# Patient Record
Sex: Female | Born: 1985 | Race: White | Hispanic: No | Marital: Married
Health system: Southern US, Community
[De-identification: ages and names within clinical notes are randomized; demographics above are authoritative.]

## PROBLEM LIST (undated history)

## (undated) HISTORY — PX: APPENDECTOMY: SHX54

---

## 2020-03-24 DIAGNOSIS — M549 Dorsalgia, unspecified: Secondary | ICD-10-CM | POA: Insufficient documentation

## 2020-04-11 NOTE — Progress Notes (Unsigned)
[facility] [address] [place] [address] Phone: [phone] Subjective:   Bruce Donath, am serving as a scribe for Dr. Antoine Primas. This visit occurred during the SARS-CoV-2 public health emergency.  Safety protocols were in place, including screening questions prior to the visit, additional usage of staff PPE, and extensive cleaning of exam room while observing appropriate contact time as indicated for disinfecting solutions.   I'm seeing this patient by the request  of:  Patient, No Pcp Per  CC: Low back pain  QQI:[id number]  Pamela Kaiser is a 35 y.o. female coming in with complaint of back pain since January 28th. Patient states that she is having right lower back pain around T12-L1. Also has chronic neck and left shoulder pain. Notices sharp pain with lumbar flexion that can radiate into right hip. 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 Has tried Tylenol for pain relief.  Patient is allergic to anti-inflammatories so has not taken any.       Social History   Socioeconomic History  . Marital status: Married    Spouse name: Not on file  . Number of children: Not on file  . Years of education: Not on file  . Highest education level: Not on file  Occupational History  . Not on file  Tobacco Use  . Smoking status: Not on file  . Smokeless tobacco: Not on file  Substance and Sexual Activity  . Alcohol use: Not on file  . Drug use: Not on file  . Sexual activity: Not on file  Other Topics Concern  . Not on file  Social History Narrative  . Not on file   Social Determinants of Health   Financial Resource Strain: Not on file  Food Insecurity: Not on file  Transportation Needs: Not on file  Physical Activity: Not on file  Stress: Not on file  Social Connections: Not on file   Allergies  Allergen Reactions  . Nsaids Anaphylaxis   No family history on file.  Current Outpatient Medications (Endocrine & Metabolic):  .   predniSONE (DELTASONE) 20 MG tablet, Take 1 tablet (20 mg total) by mouth daily with breakfast.      Current Outpatient Medications (Other):  .  amphetamine-dextroamphetamine (ADDERALL XR) 15 MG 24 hr capsule, Take by mouth every morning. Marland Kitchen  tiZANidine (ZANAFLEX) 2 MG tablet, Take 1 tablet (2 mg total) by mouth at bedtime.   Reviewed prior external information including notes and imaging from  primary care provider As well as notes that were available from care everywhere and other healthcare systems.  Past medical history, social, surgical and family history all reviewed in electronic medical record.  No pertanent information unless stated regarding to the chief complaint.   Review of Systems:  No headache, visual changes, nausea, vomiting, diarrhea, constipation, dizziness, abdominal pain, skin rash, fevers, chills, night sweats, weight loss, swollen lymph nodes, joint swelling, chest pain, shortness of breath, mood changes. POSITIVE muscle aches  Objective  Blood pressure 110/80, pulse (!) 106, height 5' 2.5" (1.588 m), weight 122 lb (55.3 kg), SpO2 98 %.   General: No apparent distress alert and oriented x3 mood and affect normal, dressed appropriately.  HEENT: Pupils equal, extraocular movements intact  Respiratory: Patient's speak in full sentences and does not appear short of breath  Cardiovascular: No lower extremity edema, non tender, no erythema  MSK: Patient's low back exam does have some loss of lordosis.  Patient does have tenderness to  palpation in the paraspinal musculature mostly around the L1-L2 area on the right.  No spinous process tenderness.  No abdominal pain noted.  No abdominal guarding noted.  Negative straight leg test.  Very minimal discomfort with Pearlean Brownie on the left side that causes more pain on the right side.  Good extension more pain with flexion of the back but does have full range of motion.  Osteopathic findings L2 flexed rotated and side bent  right Sacrum right on right   [id number]; 15 additional minutes spent for Therapeutic exercises as stated in above notes.  This included exercises focusing on stretching, strengthening, with significant focus on eccentric aspects.   Long term goals include an improvement in range of motion, strength, endurance as well as avoiding reinjury. Patient's frequency would include in 1-2 times a day, 3-5 times a week for a duration of 6-12 weeks. Low back exercises that included:  Pelvic tilt/bracing instruction to focus on control of the pelvic girdle and lower abdominal muscles  Glute strengthening exercises, focusing on proper firing of the glutes without engaging the low back muscles Proper stretching techniques for maximum relief for the hamstrings, hip flexors, low back and some rotation where tolerated   Proper technique shown and discussed handout in great detail with ATC.  All questions were discussed and answered.      Impression and Recommendations:     The above documentation has been reviewed and is accurate and complete Judi Saa, DO

## 2020-04-12 ENCOUNTER — Other Ambulatory Visit: Payer: Self-pay

## 2020-04-12 ENCOUNTER — Ambulatory Visit: Payer: 59 | Admitting: Family Medicine

## 2020-04-12 ENCOUNTER — Encounter: Payer: Self-pay | Admitting: Family Medicine

## 2020-04-12 ENCOUNTER — Ambulatory Visit (INDEPENDENT_AMBULATORY_CARE_PROVIDER_SITE_OTHER): Payer: 59

## 2020-04-12 VITALS — BP 110/80 | HR 106 | Ht 62.5 in | Wt 122.0 lb

## 2020-04-12 DIAGNOSIS — M545 Low back pain, unspecified: Secondary | ICD-10-CM | POA: Diagnosis not present

## 2020-04-12 DIAGNOSIS — M542 Cervicalgia: Secondary | ICD-10-CM | POA: Diagnosis not present

## 2020-04-12 DIAGNOSIS — M999 Biomechanical lesion, unspecified: Secondary | ICD-10-CM | POA: Diagnosis not present

## 2020-04-12 DIAGNOSIS — F909 Attention-deficit hyperactivity disorder, unspecified type: Secondary | ICD-10-CM | POA: Insufficient documentation

## 2020-04-12 MED ORDER — PREDNISONE 20 MG PO TABS: 20.0000 mg | ORAL_TABLET | Freq: Every day | ORAL | 0 refills | Status: AC

## 2020-04-12 MED ORDER — TIZANIDINE HCL 2 MG PO TABS: 2.0000 mg | ORAL_TABLET | Freq: Every day | ORAL | 0 refills | Status: DC

## 2020-04-12 NOTE — Assessment & Plan Note (Signed)
   Decision today to treat with OMT was based on Physical Exam  After verbal consent patient was treated with , ME, FPR techniques in  lumbar and sacral areas, all areas are chronic   Patient tolerated the procedure well with improvement in symptoms  Patient given exercises, stretches and lifestyle modifications  See medications in patient instructions if given  Patient will follow up in 4 weeks

## 2020-04-12 NOTE — Assessment & Plan Note (Signed)
Right-sided lower back pain that seems to be localized.  Seems to be more in the L2 vicinity right side.  Discussed that this is to be more of a muscle spasm.  Patient does have a history of ovarian cyst.  Encouraged her to monitor for any signs and symptoms of this.  Patient understands what to look out for.  Patient denies any fevers chills any abnormal weight loss or weight gain.  Denies any changes in cardiac.  X-rays ordered today to further evaluate for any bony abnormality but I think it is highly unlikely.  Patient denies any recent illnesses any dysuria.  Patient given a short course of prednisone with patient having an allergy to ibuprofen.  Given low dose of a muscle relaxer and work with Event organiser to learn home exercises.  Follow-up with me again in 4 weeks.  Attempted muscle energy osteopathic angulation today as well

## 2020-04-12 NOTE — Patient Instructions (Signed)
Prednisone 20mg  for 5 days 2mg  zanaflex at night for 3 nights then as needed Xray on way out Watch for signs for ovarian cyst See me in 4-5 weeks

## 2020-05-11 ENCOUNTER — Ambulatory Visit: Payer: 59 | Admitting: Family Medicine

## 2020-06-08 ENCOUNTER — Ambulatory Visit: Payer: 59 | Admitting: Family Medicine

## 2020-07-21 ENCOUNTER — Other Ambulatory Visit: Payer: Self-pay | Admitting: Family Medicine

## 2020-08-18 ENCOUNTER — Other Ambulatory Visit: Payer: Self-pay | Admitting: Family Medicine

## 2020-09-19 ENCOUNTER — Other Ambulatory Visit: Payer: Self-pay | Admitting: Family Medicine

## 2020-09-27 ENCOUNTER — Emergency Department (HOSPITAL_COMMUNITY)
Admission: EM | Admit: 2020-09-27 | Discharge: 2020-09-27 | Disposition: A | Discharge status: Home / Self Care | Payer: Worker's Compensation | Attending: Emergency Medicine | Admitting: Emergency Medicine

## 2020-09-27 ENCOUNTER — Emergency Department (HOSPITAL_COMMUNITY): Payer: Worker's Compensation

## 2020-09-27 ENCOUNTER — Other Ambulatory Visit: Payer: Self-pay

## 2020-09-27 ENCOUNTER — Encounter (HOSPITAL_COMMUNITY): Payer: Self-pay | Admitting: Emergency Medicine

## 2020-09-27 DIAGNOSIS — M25571 Pain in right ankle and joints of right foot: Secondary | ICD-10-CM | POA: Diagnosis not present

## 2020-09-27 DIAGNOSIS — X501XXA Overexertion from prolonged static or awkward postures, initial encounter: Secondary | ICD-10-CM | POA: Diagnosis not present

## 2020-09-27 DIAGNOSIS — S92001A Unspecified fracture of right calcaneus, initial encounter for closed fracture: Secondary | ICD-10-CM

## 2020-09-27 DIAGNOSIS — Y92481 Parking lot as the place of occurrence of the external cause: Secondary | ICD-10-CM | POA: Diagnosis not present

## 2020-09-27 DIAGNOSIS — S92021A Displaced fracture of anterior process of right calcaneus, initial encounter for closed fracture: Secondary | ICD-10-CM | POA: Diagnosis not present

## 2020-09-27 DIAGNOSIS — Z20822 Contact with and (suspected) exposure to covid-19: Secondary | ICD-10-CM | POA: Diagnosis not present

## 2020-09-27 DIAGNOSIS — Y9301 Activity, walking, marching and hiking: Secondary | ICD-10-CM | POA: Diagnosis not present

## 2020-09-27 DIAGNOSIS — I951 Orthostatic hypotension: Secondary | ICD-10-CM

## 2020-09-27 DIAGNOSIS — S99911A Unspecified injury of right ankle, initial encounter: Secondary | ICD-10-CM | POA: Diagnosis present

## 2020-09-27 DIAGNOSIS — Y990 Civilian activity done for income or pay: Secondary | ICD-10-CM | POA: Diagnosis not present

## 2020-09-27 LAB — CBC WITH DIFFERENTIAL/PLATELET
Abs Immature Granulocytes: 0.03 10*3/uL (ref 0.00–0.07)
Basophils Absolute: 0.1 10*3/uL (ref 0.0–0.1)
Basophils Relative: 1 %
Eosinophils Absolute: 0.1 10*3/uL (ref 0.0–0.5)
Eosinophils Relative: 1 %
HCT: 41.6 % (ref 36.0–46.0)
Hemoglobin: 13.4 g/dL (ref 12.0–15.0)
Immature Granulocytes: 0 %
Lymphocytes Relative: 27 %
Lymphs Abs: 2.2 10*3/uL (ref 0.7–4.0)
MCH: 31.3 pg (ref 26.0–34.0)
MCHC: 32.2 g/dL (ref 30.0–36.0)
MCV: 97.2 fL (ref 80.0–100.0)
Monocytes Absolute: 0.4 10*3/uL (ref 0.1–1.0)
Monocytes Relative: 5 %
Neutro Abs: 5.4 10*3/uL (ref 1.7–7.7)
Neutrophils Relative %: 66 %
Platelets: 398 10*3/uL (ref 150–400)
RBC: 4.28 MIL/uL (ref 3.87–5.11)
RDW: 11.9 % (ref 11.5–15.5)
WBC: 8.2 10*3/uL (ref 4.0–10.5)
nRBC: 0 % (ref 0.0–0.2)

## 2020-09-27 LAB — RESP PANEL BY RT-PCR (FLU A&B, COVID) ARPGX2
Influenza A by PCR: NEGATIVE
Influenza B by PCR: NEGATIVE
SARS Coronavirus 2 by RT PCR: NEGATIVE

## 2020-09-27 LAB — URINALYSIS, ROUTINE W REFLEX MICROSCOPIC
Bilirubin Urine: NEGATIVE
Glucose, UA: NEGATIVE mg/dL
Ketones, ur: NEGATIVE mg/dL
Leukocytes,Ua: NEGATIVE
Nitrite: NEGATIVE
Protein, ur: NEGATIVE mg/dL
Specific Gravity, Urine: 1.008 (ref 1.005–1.030)
pH: 6 (ref 5.0–8.0)

## 2020-09-27 LAB — BASIC METABOLIC PANEL
Anion gap: 10 (ref 5–15)
BUN: 8 mg/dL (ref 6–20)
CO2: 23 mmol/L (ref 22–32)
Calcium: 8.6 mg/dL — ABNORMAL LOW (ref 8.9–10.3)
Chloride: 106 mmol/L (ref 98–111)
Creatinine, Ser: 0.65 mg/dL (ref 0.44–1.00)
GFR, Estimated: >60 mL/min (ref >60)
Glucose, Bld: 113 mg/dL — ABNORMAL HIGH (ref 70–99)
Potassium: 4 mmol/L (ref 3.5–5.1)
Sodium: 139 mmol/L (ref 135–145)

## 2020-09-27 LAB — I-STAT BETA HCG BLOOD, ED (MC, WL, AP ONLY): I-stat hCG, quantitative: <5 m[IU]/mL (ref <5)

## 2020-09-27 LAB — CBG MONITORING, ED: Glucose-Capillary: 88 mg/dL (ref 70–99)

## 2020-09-27 MED ORDER — HYDROCODONE-ACETAMINOPHEN 5-325 MG PO TABS: 1.0000 | ORAL_TABLET | ORAL | 0 refills | Status: AC | PRN

## 2020-09-27 MED ORDER — SODIUM CHLORIDE 0.9 % IV SOLN: INTRAVENOUS | Status: DC

## 2020-09-27 MED ORDER — ACETAMINOPHEN 500 MG PO TABS
1000.0000 mg | ORAL_TABLET | Freq: Once | ORAL | Status: AC
  Administered 2020-09-27: 1000 mg via ORAL
  Filled 2020-09-27: qty 2

## 2020-09-27 MED ORDER — SODIUM CHLORIDE 0.9 % IV BOLUS
1000.0000 mL | Freq: Once | INTRAVENOUS | Status: AC
  Administered 2020-09-27: 1000 mL via INTRAVENOUS

## 2020-09-27 NOTE — ED Provider Notes (Signed)
[facility]-EMERGENCY DEPT Provider Note   CSN: [id number] Arrival date & time: 09/27/20  5631     History Chief Complaint  Patient presents with   Hypotension    Pamela Kaiser is a 35 y.o. female.  Pt presents to the ED today with a near-syncopal event.  Pt said she woke up today with a little cough and eyes watering.  She took a Production designer, theatre/television/film and went to work.  She otherwise felt fine.  When she got out of the car, she said her legs felt like jello and her head felt really weird and she felt like she was going to pass out.  She sat down in the parking lot, but it was really awkward and she twisted her right ankle. She does not think she passed out completely.  She tried to stand up, but felt dizzy again, so she sat back down.   EMS said her initial BP was 80/50.  They gave her 300 cc of NS and bp back to nl.  She is feeling better.  She denies any n/v/d.        History reviewed. No pertinent past medical history.  Patient Active Problem List   Diagnosis Date Noted   Lower back pain 04/12/2020   Nonallopathic lesion of lumbar region 04/12/2020   Attention-deficit hyperactivity disorder, unspecified type 04/12/2020   Back pain 03/24/2020    History reviewed. No pertinent surgical history.   OB History   No obstetric history on file.     History reviewed. No pertinent family history.     Home Medications Prior to Admission medications   Medication Sig Start Date End Date Taking? Authorizing Provider  amphetamine-dextroamphetamine (ADDERALL XR) 15 MG 24 hr capsule Take 15 mg by mouth every morning.   Yes [provider]  cetirizine (ZYRTEC) 10 MG tablet Take 10 mg by mouth at bedtime as needed for allergies.   Yes [provider]  HYDROcodone-acetaminophen (NORCO/VICODIN) 5-325 MG tablet Take 1 tablet by mouth every 4 (four) hours as needed. 09/27/20  Yes Jacalyn Lefevre, MD  Multiple Vitamins-Minerals (HAIR SKIN AND NAILS FORMULA PO) Take  1 tablet by mouth at bedtime.   Yes [provider]  Pseudoephedrine-APAP-DM (DAYQUIL PO) Take 2 capsules by mouth every 6 (six) hours as needed (congestion/cold).   Yes [provider]  tiZANidine (ZANAFLEX) 2 MG tablet TAKE 1 TABLET BY MOUTH EVERYDAY AT BEDTIME Patient taking differently: Take 2 mg by mouth at bedtime as needed for muscle spasms. 09/19/20  Yes Antoine Primas M, DO  TRI-ESTARYLLA 0.18/0.215/0.25 MG-35 MCG tablet Take 1 tablet by mouth daily. 07/27/20  Yes [provider]  predniSONE (DELTASONE) 20 MG tablet Take 1 tablet (20 mg total) by mouth daily with breakfast. Patient not taking: No sig reported 04/12/20   Judi Saa, DO    Allergies    Aspirin and Nsaids  Review of Systems   Review of Systems  Musculoskeletal:        Right ankle pain  Neurological:  Positive for syncope and light-headedness.  All other systems reviewed and are negative.  Physical Exam Updated Vital Signs BP (!) 139/97   Pulse 80   Temp 97.9 F (36.6 C)   Resp 15   SpO2 100%   Physical Exam Vitals and nursing note reviewed.  Constitutional:      Appearance: Normal appearance.  HENT:     Head: Normocephalic and atraumatic.     Right Ear: External ear normal.  Left Ear: External ear normal.     Nose: Nose normal.     Mouth/Throat:     Mouth: Mucous membranes are dry.  Eyes:     Extraocular Movements: Extraocular movements intact.     Conjunctiva/sclera: Conjunctivae normal.     Pupils: Pupils are equal, round, and reactive to light.  Cardiovascular:     Rate and Rhythm: Normal rate and regular rhythm.     Pulses: Normal pulses.     Heart sounds: Normal heart sounds.  Pulmonary:     Effort: Pulmonary effort is normal.     Breath sounds: Normal breath sounds.  Abdominal:     General: Abdomen is flat. Bowel sounds are normal.     Palpations: Abdomen is soft.  Musculoskeletal:        General: Normal range of motion.     Cervical back: Normal range  of motion and neck supple.       Legs:  Skin:    General: Skin is warm.     Capillary Refill: Capillary refill takes less than 2 seconds.  Neurological:     General: No focal deficit present.     Mental Status: She is alert and oriented to person, place, and time.  Psychiatric:        Mood and Affect: Mood normal.        Behavior: Behavior normal.    ED Results / Procedures / Treatments   Labs (all labs ordered are listed, but only abnormal results are displayed) Labs Reviewed  BASIC METABOLIC PANEL - Abnormal; Notable for the following components:      Result Value   Glucose, Bld 113 (*)    Calcium 8.6 (*)    All other components within normal limits  URINALYSIS, ROUTINE W REFLEX MICROSCOPIC - Abnormal; Notable for the following components:   Hgb urine dipstick SMALL (*)    Bacteria, UA RARE (*)    All other components within normal limits  RESP PANEL BY RT-PCR (FLU A&B, COVID) ARPGX2  CBC WITH DIFFERENTIAL/PLATELET  CBG MONITORING, ED  I-STAT BETA HCG BLOOD, ED ([facility], [facility], [facility] ONLY)    EKG EKG Interpretation  Date/Time:  Wednesday September 27 2020 08:59:55 EDT Ventricular Rate:  76 PR Interval:  140 QRS Duration: 86 QT Interval:  432 QTC Calculation: 486 R Axis:   63 Text Interpretation: Sinus rhythm RSR' in V1 or V2, probably normal variant Borderline prolonged QT interval No old tracing to compare Confirmed by Jacalyn Lefevre [phone]) on 09/27/2020 9:11:16 AM  Radiology DG Ankle Complete Right  Result Date: 09/27/2020 CLINICAL DATA:  Right ankle trauma EXAM: RIGHT ANKLE - COMPLETE 3+ VIEW COMPARISON:  None. FINDINGS: Mildly displaced fracture of the lateral anterior process of the calcaneus. Ankle mortise is maintained on nonweightbearing views. Joint spaces are well maintained. Normal mineralization. Soft tissue swelling of the ankle. IMPRESSION: Mildly displaced fracture of the lateral anterior process of the calcaneus. Electronically Signed   By: Allegra Lai MD   On:  09/27/2020 10:37    Procedures Procedures   Medications Ordered in ED Medications  sodium chloride 0.9 % bolus 1,000 mL (1,000 mLs Intravenous Bolus 09/27/20 0900)    And  0.9 %  sodium chloride infusion ( Intravenous New Bag/Given 09/27/20 0910)  acetaminophen (TYLENOL) tablet 1,000 mg (1,000 mg Oral Given 09/27/20 0944)    ED Course  I have reviewed the triage vital signs and the nursing notes.  Pertinent labs & imaging results that were available during my  care of the patient were reviewed by me and considered in my medical decision making (see chart for details).    MDM Rules/Calculators/A&P                           BP is better after IVFs.  She is feeling better.  Ankle xray shows a calcaneus fx.  Pt placed in a cam walker and instructed to f/u with Dr. Everardo Pacific.  She is stable for d/c.  Return if worse.  Avoid dayquil. Final Clinical Impression(s) / ED Diagnoses Final diagnoses:  Orthostatic hypotension  Closed displaced fracture of right calcaneus, unspecified portion of calcaneus, initial encounter    Rx / DC Orders ED Discharge Orders          Ordered    HYDROcodone-acetaminophen (NORCO/VICODIN) 5-325 MG tablet  Every 4 hours PRN        09/27/20 1120             Jacalyn Lefevre, MD 09/27/20 1125

## 2020-09-27 NOTE — ED Triage Notes (Signed)
BIBA from work d/t near syncopal episode when walking into work. BP 80/50 & HR 80 initially, then 130/80 after 300 cc fluid. Orthostatics negative. CBG 102. No significant medical history.   18 G LAC

## 2020-10-19 ENCOUNTER — Other Ambulatory Visit: Payer: Self-pay | Admitting: Family Medicine

## 2020-11-10 ENCOUNTER — Other Ambulatory Visit: Payer: Self-pay | Admitting: Family Medicine

## 2020-12-08 ENCOUNTER — Other Ambulatory Visit: Payer: Self-pay | Admitting: Family Medicine

## 2021-01-05 ENCOUNTER — Other Ambulatory Visit: Payer: Self-pay | Admitting: Family Medicine

## 2021-02-03 ENCOUNTER — Other Ambulatory Visit: Payer: Self-pay | Admitting: Family Medicine

## 2021-03-03 ENCOUNTER — Other Ambulatory Visit: Payer: Self-pay | Admitting: Family Medicine

## 2021-03-28 ENCOUNTER — Other Ambulatory Visit: Payer: Self-pay | Admitting: Family Medicine

## 2022-04-24 IMAGING — DX DG ANKLE COMPLETE 3+V*R*
3 series · 3 of 3 positions shown · non-contrast
Comparison: None.

CLINICAL DATA: Right ankle trauma

EXAM:
RIGHT ANKLE - COMPLETE 3+ VIEW

[ankle ap]
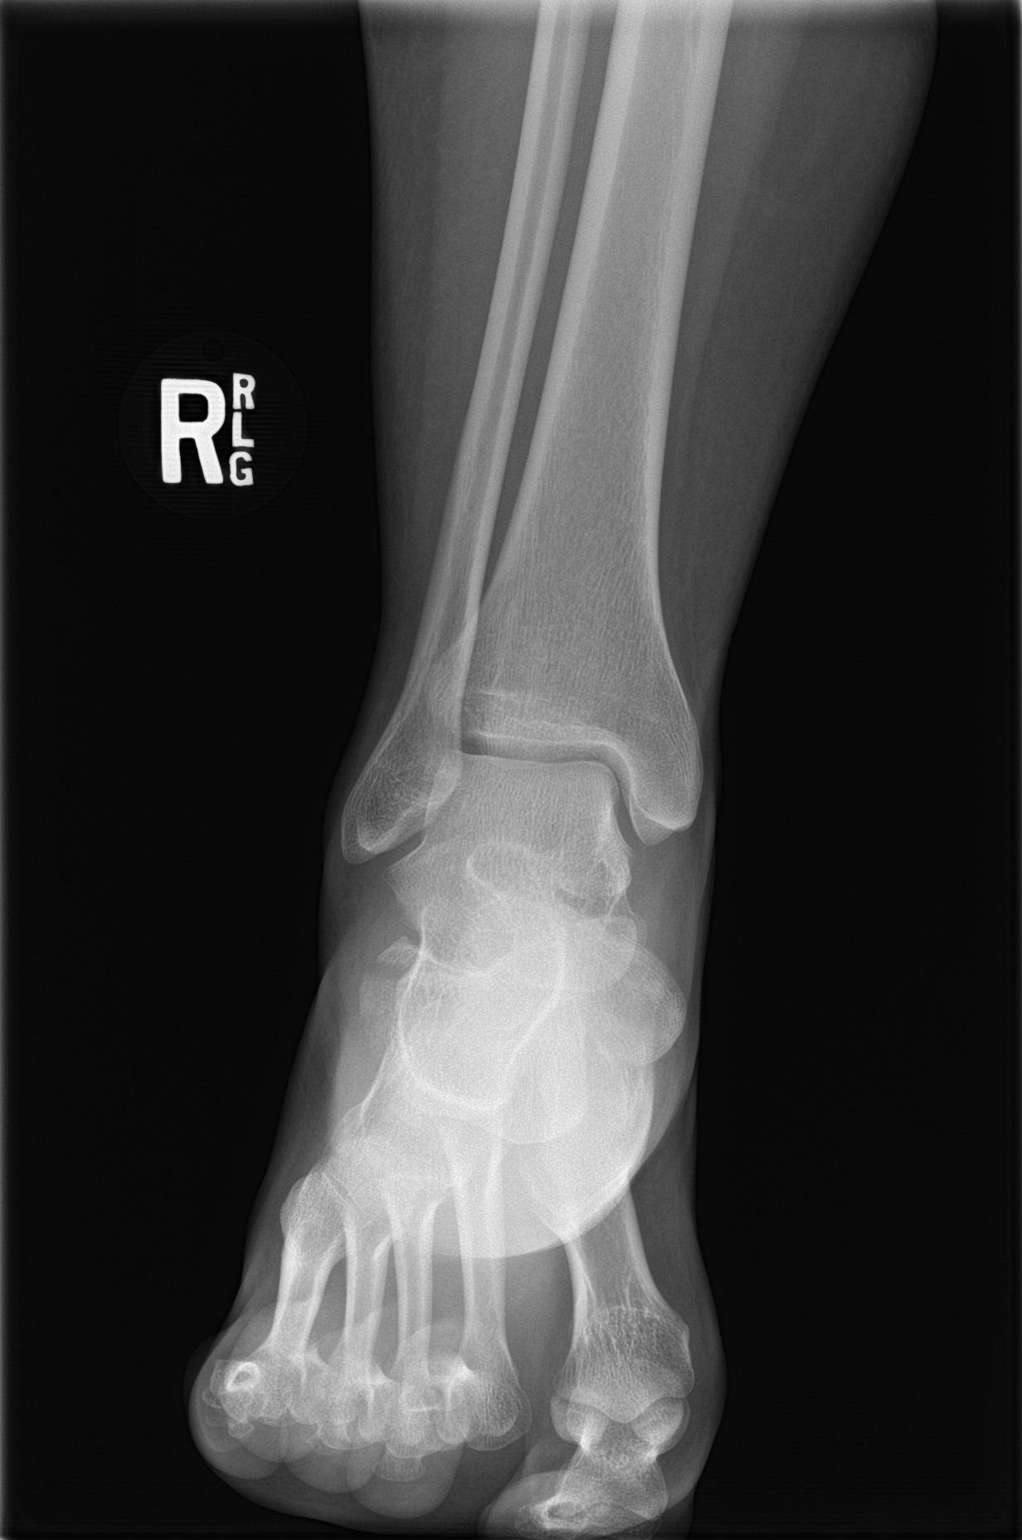

[ankle obl]
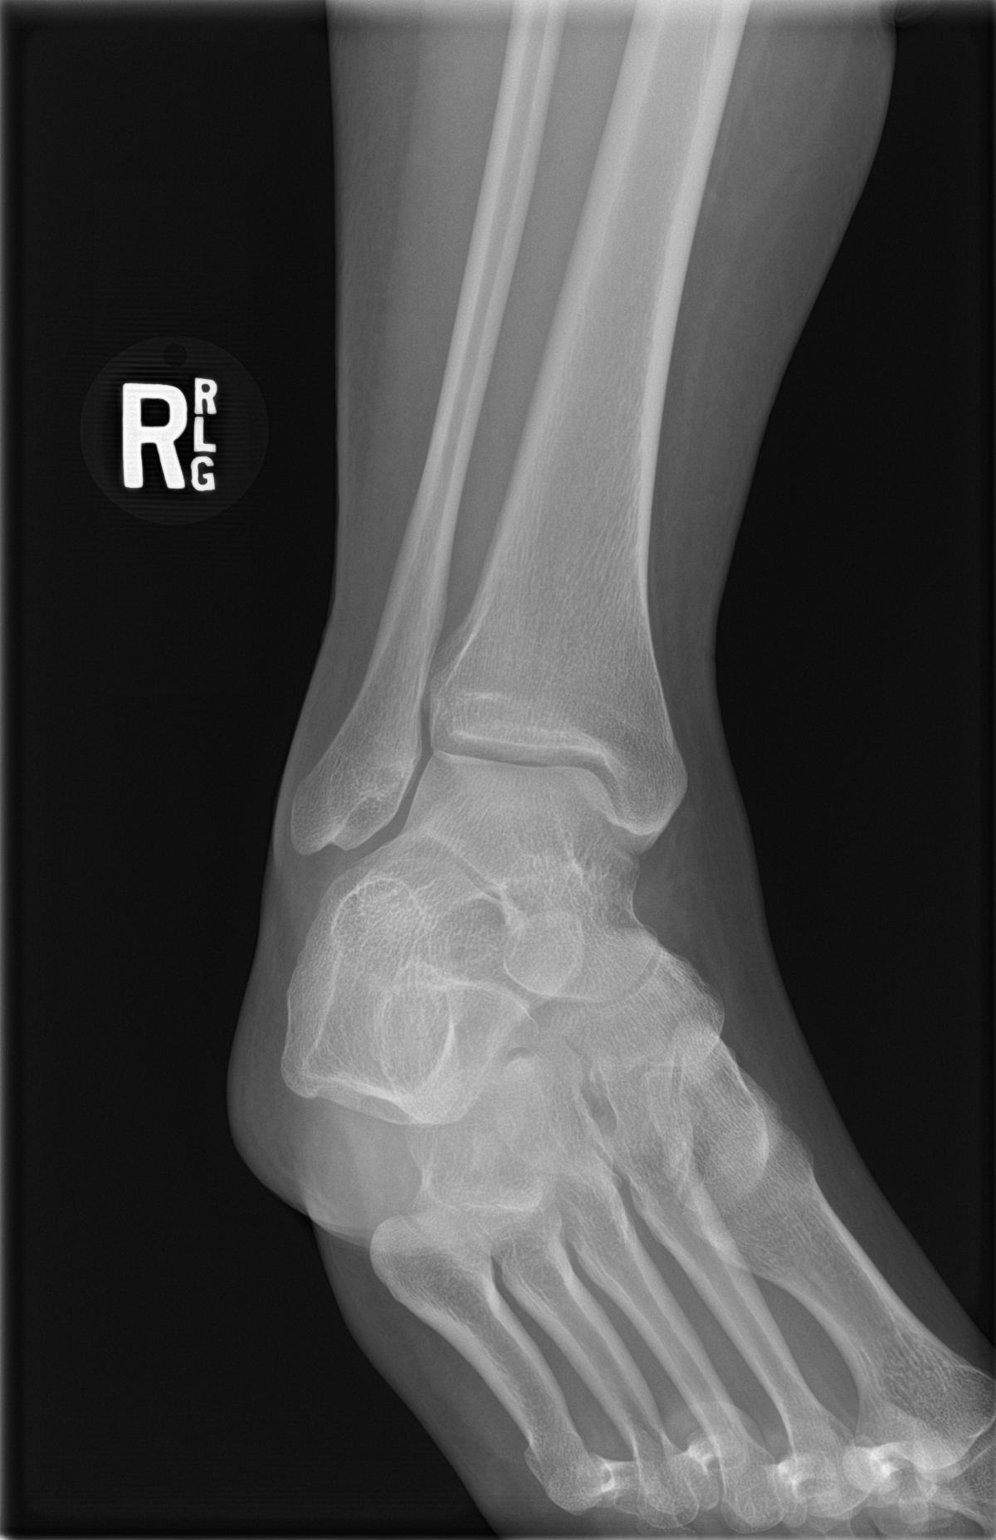

[ankle lat]
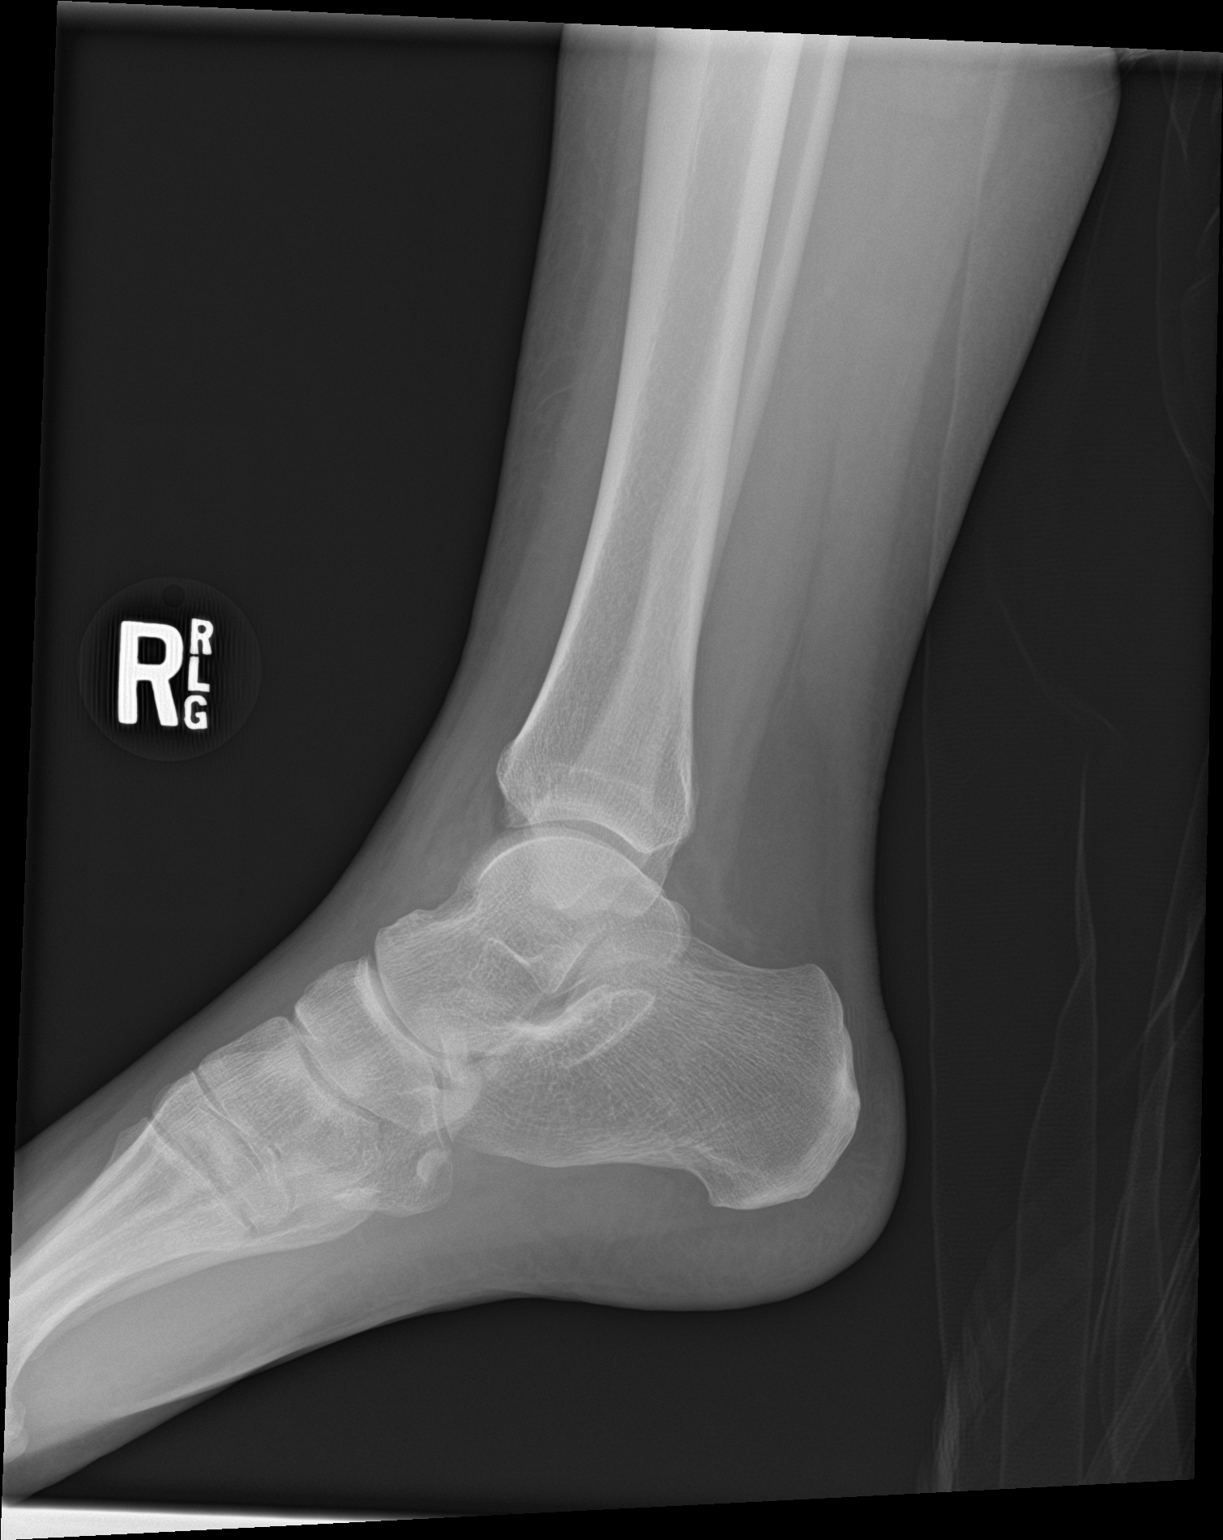

[3 of 3 positions shown; findings below may reference images not displayed]

FINDINGS: Mildly displaced fracture of the lateral anterior process of the
calcaneus. Ankle mortise is maintained on nonweightbearing views.
Joint spaces are well maintained. Normal mineralization. Soft tissue
swelling of the ankle.
IMPRESSION: Mildly displaced fracture of the lateral anterior process of the
calcaneus.

## 2023-09-30 DIAGNOSIS — K649 Unspecified hemorrhoids: Secondary | ICD-10-CM | POA: Diagnosis not present

## 2023-11-02 ENCOUNTER — Other Ambulatory Visit (HOSPITAL_COMMUNITY)

## 2023-11-02 ENCOUNTER — Other Ambulatory Visit: Payer: Self-pay

## 2023-11-02 ENCOUNTER — Encounter (HOSPITAL_BASED_OUTPATIENT_CLINIC_OR_DEPARTMENT_OTHER): Payer: Self-pay

## 2023-11-02 ENCOUNTER — Emergency Department (HOSPITAL_BASED_OUTPATIENT_CLINIC_OR_DEPARTMENT_OTHER)

## 2023-11-02 ENCOUNTER — Emergency Department (HOSPITAL_BASED_OUTPATIENT_CLINIC_OR_DEPARTMENT_OTHER): Admission: EM | Admit: 2023-11-02 | Discharge: 2023-11-03 | Disposition: A | Discharge status: Home / Self Care

## 2023-11-02 DIAGNOSIS — D259 Leiomyoma of uterus, unspecified: Secondary | ICD-10-CM | POA: Diagnosis not present

## 2023-11-02 DIAGNOSIS — K769 Liver disease, unspecified: Secondary | ICD-10-CM | POA: Diagnosis not present

## 2023-11-02 DIAGNOSIS — R102 Pelvic and perineal pain: Secondary | ICD-10-CM | POA: Diagnosis not present

## 2023-11-02 DIAGNOSIS — R1031 Right lower quadrant pain: Secondary | ICD-10-CM | POA: Insufficient documentation

## 2023-11-02 DIAGNOSIS — D219 Benign neoplasm of connective and other soft tissue, unspecified: Secondary | ICD-10-CM

## 2023-11-02 LAB — CBC WITH DIFFERENTIAL/PLATELET
Abs Immature Granulocytes: 0.03 K/uL (ref 0.00–0.07)
Basophils Absolute: 0.1 K/uL (ref 0.0–0.1)
Basophils Relative: 1 %
Eosinophils Absolute: 0.1 K/uL (ref 0.0–0.5)
Eosinophils Relative: 1 %
HCT: 45.6 % (ref 36.0–46.0)
Hemoglobin: 15.4 g/dL — ABNORMAL HIGH (ref 12.0–15.0)
Immature Granulocytes: 0 %
Lymphocytes Relative: 49 %
Lymphs Abs: 4.1 K/uL — ABNORMAL HIGH (ref 0.7–4.0)
MCH: 31.3 pg (ref 26.0–34.0)
MCHC: 33.8 g/dL (ref 30.0–36.0)
MCV: 92.7 fL (ref 80.0–100.0)
Monocytes Absolute: 0.6 K/uL (ref 0.1–1.0)
Monocytes Relative: 7 %
Neutro Abs: 3.6 K/uL (ref 1.7–7.7)
Neutrophils Relative %: 42 %
Platelets: 540 K/uL — ABNORMAL HIGH (ref 150–400)
RBC: 4.92 MIL/uL (ref 3.87–5.11)
RDW: 12 % (ref 11.5–15.5)
WBC: 8.6 K/uL (ref 4.0–10.5)
nRBC: 0 % (ref 0.0–0.2)

## 2023-11-02 LAB — URINALYSIS, ROUTINE W REFLEX MICROSCOPIC
Bilirubin Urine: NEGATIVE
Glucose, UA: NEGATIVE mg/dL
Ketones, ur: NEGATIVE mg/dL
Leukocytes,Ua: NEGATIVE
Nitrite: NEGATIVE
Protein, ur: NEGATIVE mg/dL
Specific Gravity, Urine: 1.02 (ref 1.005–1.030)
pH: 7 (ref 5.0–8.0)

## 2023-11-02 LAB — COMPREHENSIVE METABOLIC PANEL WITH GFR
ALT: 25 U/L (ref 0–44)
AST: 23 U/L (ref 15–41)
Albumin: 4.5 g/dL (ref 3.5–5.0)
Alkaline Phosphatase: 60 U/L (ref 38–126)
Anion gap: 11 (ref 5–15)
BUN: 10 mg/dL (ref 6–20)
CO2: 26 mmol/L (ref 22–32)
Calcium: 9.4 mg/dL (ref 8.9–10.3)
Chloride: 105 mmol/L (ref 98–111)
Creatinine, Ser: 0.81 mg/dL (ref 0.44–1.00)
GFR, Estimated: >60 mL/min (ref >60)
Glucose, Bld: 115 mg/dL — ABNORMAL HIGH (ref 70–99)
Potassium: 4.3 mmol/L (ref 3.5–5.1)
Sodium: 142 mmol/L (ref 135–145)
Total Bilirubin: 0.3 mg/dL (ref 0.0–1.2)
Total Protein: 7.3 g/dL (ref 6.5–8.1)

## 2023-11-02 LAB — URINALYSIS, MICROSCOPIC (REFLEX)

## 2023-11-02 LAB — LIPASE, BLOOD: Lipase: 34 U/L (ref 11–51)

## 2023-11-02 LAB — PREGNANCY, URINE: Preg Test, Ur: NEGATIVE

## 2023-11-02 MED ORDER — HYDROMORPHONE HCL 1 MG/ML IJ SOLN: 1.0000 mg | Freq: Once | INTRAMUSCULAR | Status: DC

## 2023-11-02 MED ORDER — ONDANSETRON HCL 4 MG/2ML IJ SOLN
4.0000 mg | Freq: Once | INTRAMUSCULAR | Status: AC
  Administered 2023-11-02: 4 mg via INTRAVENOUS
  Filled 2023-11-02: qty 2

## 2023-11-02 MED ORDER — IOHEXOL 300 MG/ML  SOLN
100.0000 mL | Freq: Once | INTRAMUSCULAR | Status: AC | PRN
  Administered 2023-11-02: 100 mL via INTRAVENOUS

## 2023-11-02 MED ORDER — HYDROMORPHONE HCL 1 MG/ML IJ SOLN
0.5000 mg | Freq: Once | INTRAMUSCULAR | Status: AC
  Administered 2023-11-02: 0.5 mg via INTRAVENOUS
  Filled 2023-11-02: qty 1

## 2023-11-02 NOTE — ED Provider Notes (Signed)
 [facility] Provider Note   CSN: [id number] Arrival date & time: 11/02/23  2057     Patient presents with: Abdominal Pain   Pamela Kaiser is a 38 y.o. female.   This is a 38 year old female presenting emergency department with right lower quadrant abdominal pain/pelvic pain that started abruptly at 7:30 PM.  Had just finished eating.  Reports pain constant but varies in intensity.  Radiating towards her back and down towards her hip.  Some nausea, but no vomiting.  No urinary symptoms.  Last bowel movement today.  Has a history of prior appendectomy.  Started menstruating yesterday   Abdominal Pain      Prior to Admission medications   Medication Sig Start Date End Date Taking? Authorizing Provider  amphetamine-dextroamphetamine (ADDERALL XR) 15 MG 24 hr capsule Take 15 mg by mouth every morning.    [provider]  cetirizine (ZYRTEC) 10 MG tablet Take 10 mg by mouth at bedtime as needed for allergies.    [provider]  HYDROcodone -acetaminophen  (NORCO/VICODIN) 5-325 MG tablet Take 1 tablet by mouth every 4 (four) hours as needed. 09/27/20   Dean Clarity, MD  Multiple Vitamins-Minerals (HAIR SKIN AND NAILS FORMULA PO) Take 1 tablet by mouth at bedtime.    [provider]  predniSONE  (DELTASONE ) 20 MG tablet Take 1 tablet (20 mg total) by mouth daily with breakfast. Patient not taking: No sig reported 04/12/20   Smith, Zachary M, DO  Pseudoephedrine-APAP-DM (DAYQUIL PO) Take 2 capsules by mouth every 6 (six) hours as needed (congestion/cold).    [provider]  tiZANidine  (ZANAFLEX ) 2 MG tablet TAKE 1 TABLET BY MOUTH EVERYDAY AT BEDTIME 03/05/21   Smith, Zachary M, DO  TRI-ESTARYLLA 0.18/0.215/0.25 MG-35 MCG tablet Take 1 tablet by mouth daily. 07/27/20   [provider]    Allergies: Aspirin and Nsaids    Review of Systems  Gastrointestinal:  Positive for abdominal pain.    Updated  Vital Signs BP 108/74   Pulse 65   Temp 97.6 F (36.4 C)   Resp 20   Ht 5' 2.5 (1.588 m)   Wt 59 kg   LMP 10/31/2023 (Approximate)   SpO2 98%   BMI 23.40 kg/m   Physical Exam Vitals and nursing note reviewed.  HENT:     Head: Normocephalic.  Cardiovascular:     Rate and Rhythm: Normal rate and regular rhythm.  Pulmonary:     Effort: Pulmonary effort is normal.     Breath sounds: Normal breath sounds.  Abdominal:     General: Abdomen is flat.     Palpations: Abdomen is soft.     Tenderness: There is abdominal tenderness in the right lower quadrant. There is no guarding or rebound.  Skin:    General: Skin is warm and dry.     Capillary Refill: Capillary refill takes less than 2 seconds.  Neurological:     Mental Status: She is alert and oriented to person, place, and time.  Psychiatric:        Behavior: Behavior normal.     (all labs ordered are listed, but only abnormal results are displayed) Labs Reviewed  COMPREHENSIVE METABOLIC PANEL WITH GFR - Abnormal; Notable for the following components:      Result Value   Glucose, Bld 115 (*)    All other components within normal limits  URINALYSIS, ROUTINE W REFLEX MICROSCOPIC - Abnormal; Notable for the following components:   Hgb urine dipstick SMALL (*)  All other components within normal limits  CBC WITH DIFFERENTIAL/PLATELET - Abnormal; Notable for the following components:   Hemoglobin 15.4 (*)    Platelets 540 (*)    Lymphs Abs 4.1 (*)    All other components within normal limits  URINALYSIS, MICROSCOPIC (REFLEX) - Abnormal; Notable for the following components:   Bacteria, UA RARE (*)    All other components within normal limits  LIPASE, BLOOD  PREGNANCY, URINE    EKG: None  Radiology: CT ABDOMEN PELVIS W CONTRAST Result Date: 11/02/2023 EXAM: CT ABDOMEN AND PELVIS WITH CONTRAST 11/02/2023 10:20:00 PM TECHNIQUE: CT of the abdomen and pelvis was performed with the administration of intravenous contrast.  Multiplanar reformatted images are provided for review. Automated exposure control, iterative reconstruction, and/or weight-based adjustment of the mA/kV was utilized to reduce the radiation dose to as low as reasonably achievable. CONTRAST: 100mL iohexol  (OMNIPAQUE ) 300 MG/ML solution COMPARISON: None available. CLINICAL HISTORY: RLQ abdominal pain. Sudden severe RLQ abdominal pain that manifested after dinner 7:30PM. Says pain radiated to back and hip. Denies any urinary symptoms. FINDINGS: LOWER CHEST: No acute abnormality. LIVER: Multiple hypoattenuating lesions in the liver with nodular peripheral enhancement are compatible with benign hemangiomas. No follow up recommended. GALLBLADDER AND BILE DUCTS: Gallbladder is unremarkable. No biliary ductal dilatation. SPLEEN: No acute abnormality. PANCREAS: No acute abnormality. ADRENAL GLANDS: No acute abnormality. KIDNEYS, URETERS AND BLADDER: No stones in the kidneys or ureters. No hydronephrosis. No perinephric or periureteral stranding. Urinary bladder is unremarkable. GI AND BOWEL: Stomach demonstrates no acute abnormality. There is no bowel obstruction. Appendectomy. PERITONEUM AND RETROPERITONEUM: No ascites. No free air. VASCULATURE: Aorta is normal in caliber. LYMPH NODES: No lymphadenopathy. REPRODUCTIVE ORGANS: A large exophytic fibroid extends from the anterior uterine fundus measuring up to 7.3 cm. This exhibits mass effect on the dome of the bladder. BONES AND SOFT TISSUES: No acute osseous abnormality. No focal soft tissue abnormality. IMPRESSION: 1. No acute abnormality. 2. Large exophytic fibroid extending from the anterior uterine fundus, measuring up to 7.3 cm, with mass effect on the dome of the bladder. Electronically signed by: Norman Gatlin MD 11/02/2023 10:29 PM EDT RP Workstation: [id number]     Procedures   Medications Ordered in the ED  ondansetron  (ZOFRAN ) injection 4 mg (4 mg Intravenous Given 11/02/23 2201)  HYDROmorphone  (DILAUDID )  injection 0.5 mg (0.5 mg Intravenous Given 11/02/23 2200)  iohexol  (OMNIPAQUE ) 300 MG/ML solution 100 mL (100 mLs Intravenous Contrast Given 11/02/23 2220)    Clinical Course as of 11/02/23 2256  Sun Nov 02, 2023  2232 CT ABDOMEN PELVIS W CONTRAST IMPRESSION: 1. No acute abnormality. 2. Large exophytic fibroid extending from the anterior uterine fundus, measuring up to 7.3 cm, with mass effect on the dome of the bladder.  Electronically signed by: Norman Gatlin MD 11/02/2023 10:29 PM EDT RP Workstation: [id number]   [TY]    Clinical Course User Index [TY] Neysa Caron PARAS, DO                                 Medical Decision Making This is a 38 year old female presenting emergency department for severe abdominal pain and right lower quadrant.  Afebrile nontachycardic, normotensive.  Soft abdomen with some tenderness in the right lower quadrant.  Required IV Dilaudid  with some improvement of her pain, but still ongoing labs with no overt etiology.  No leukocytosis to suggest systemic infection.  No anemia.  No significant metabolic derangements.  Normal kidney function.  No transaminitis to suggest hepatobiliary disease.  Lipase is normal.  Pancreatitis unlikely.  Pregnancy test is negative.  Ectopic pregnancy unlikely.  UA not consistent with urinary tract infection.  CT abdomen with fibroid which may be causing her symptoms.  However given his abrupt onset and continued symptoms concern for possible torsion.  Reportedly do not have ultrasound capabilities at [facility], discussed with [place], ED Dr. Randol who agrees to accept patient for transfer.  Discussed with patients who are agreeable to transfer.  Will go by POV.  Amount and/or Complexity of Data Reviewed Labs: ordered. Radiology: ordered. Decision-making details documented in ED Course.  Risk Prescription drug management.      Final diagnoses:  Right lower quadrant abdominal pain  Fibroid    ED Discharge Orders      None          Neysa Caron PARAS, DO 11/02/23 2256

## 2023-11-02 NOTE — ED Notes (Signed)
 LNMP just started yesterday

## 2023-11-02 NOTE — Discharge Instructions (Addendum)
 Ultrasound with uterine fibroids but normal ovaries with good blood flow. Recommend follow-up with OB-GYN--- can see our local clinic or one of the other private clinics ([facility], [facility], [facility], [facility] [employer], etc). Return here for new concerns.

## 2023-11-02 NOTE — ED Triage Notes (Signed)
 Sudden severe RLQ abdominal pain that manifested after dinner 7:30PM.   Says pain radiated to back and hip.   Denies any urinary symptoms.

## 2023-11-03 ENCOUNTER — Emergency Department (HOSPITAL_COMMUNITY)

## 2023-11-03 DIAGNOSIS — R102 Pelvic and perineal pain: Secondary | ICD-10-CM | POA: Diagnosis not present

## 2023-11-03 DIAGNOSIS — D259 Leiomyoma of uterus, unspecified: Secondary | ICD-10-CM | POA: Diagnosis not present

## 2023-11-03 NOTE — ED Notes (Signed)
 Patient transported to ultrasound.

## 2023-11-03 NOTE — ED Provider Notes (Signed)
   US  PELVIC TRANSABD W/PELVIC DOPPLER Result Date: 11/03/2023 CLINICAL DATA:  [id number].  Pelvic pain. EXAM: TRANSABDOMINAL ULTRASOUND OF PELVIS DOPPLER ULTRASOUND OF OVARIES TECHNIQUE: Transabdominal ultrasound examination of the pelvis was performed including evaluation of the uterus, ovaries, adnexal regions, and pelvic cul-de-sac. Color and duplex Doppler ultrasound was utilized to evaluate blood flow to the ovaries. COMPARISON:  CT with IV contrast from yesterday. FINDINGS: Uterus Measurements: Anteverted measuring 7.6 x 3.6 x 4.8 cm = volume: 67.8 mL. There is a large exophytic heterogeneously hypoechoic fibroid arising from the ventral body of the uterus, eccentric to the left and measuring 4.7 x 5.4 x 7.0 cm, with compressive effect on the underlying bladder dome. No other discrete myometrial abnormality is seen. The cervix is largely obscured by the fibroid and adjacent bowel gas. Endometrium Thickness: 2.8 mm.  No focal abnormality visualized. Right ovary Measurements: 2.8 x 1.8 x 3.2 cm = volume: 8.3 mL. Normal appearance/no adnexal mass. Left ovary Measurements: 2.9 x 2.0 x 1.8 cm = volume: 5.7 mL. Normal appearance/no adnexal mass. Pulsed Doppler evaluation demonstrates normal low-resistance arterial and venous waveforms in both ovaries. Other: No free fluid. There is echogenic layering debris within the bladder. Correlate with urinalysis for possible significance. IMPRESSION: 1. Exophytic fibroid arising from the ventral body of the uterus, eccentric to the left and measuring 4.7 x 5.4 x 7.0 cm, with compressive effect on the underlying bladder. 2. Echogenic layering debris within the bladder. Correlate with urinalysis for possible significance. 3. No evidence of ovarian torsion or mass. Electronically Signed   By: Francis Quam M.D.   On: 11/03/2023 01:46    US  with uterine fibroids but normal blood flow to ovaries.  Will need follow-up with dedicated OB-GYN.  Information for local offices given.   School note provided.  Can return here for new concerns.   Jarold Olam HERO, PA-C 11/03/23 0223    Theadore Ozell HERO, MD 11/03/23 [phone]

## 2023-11-21 DIAGNOSIS — D219 Benign neoplasm of connective and other soft tissue, unspecified: Secondary | ICD-10-CM | POA: Diagnosis not present

## 2024-02-16 DIAGNOSIS — E782 Mixed hyperlipidemia: Secondary | ICD-10-CM | POA: Diagnosis not present

## 2024-02-16 DIAGNOSIS — F909 Attention-deficit hyperactivity disorder, unspecified type: Secondary | ICD-10-CM | POA: Diagnosis not present

## 2024-02-16 DIAGNOSIS — Z124 Encounter for screening for malignant neoplasm of cervix: Secondary | ICD-10-CM | POA: Diagnosis not present

## 2024-02-16 DIAGNOSIS — I493 Ventricular premature depolarization: Secondary | ICD-10-CM | POA: Diagnosis not present

## 2024-02-16 DIAGNOSIS — Z0000 Encounter for general adult medical examination without abnormal findings: Secondary | ICD-10-CM | POA: Diagnosis not present

## 2024-02-16 DIAGNOSIS — D259 Leiomyoma of uterus, unspecified: Secondary | ICD-10-CM | POA: Diagnosis not present

## 2024-02-16 DIAGNOSIS — Z1231 Encounter for screening mammogram for malignant neoplasm of breast: Secondary | ICD-10-CM | POA: Diagnosis not present
